# Patient Record
Sex: Male | Born: 1980 | Hispanic: No | Marital: Married | Smoking: Never smoker
Health system: Southern US, Community
[De-identification: ages and names within clinical notes are randomized; demographics above are authoritative.]

## PROBLEM LIST (undated history)

## (undated) DIAGNOSIS — S46819A Strain of other muscles, fascia and tendons at shoulder and upper arm level, unspecified arm, initial encounter: Secondary | ICD-10-CM

## (undated) HISTORY — PX: NO PAST SURGERIES: SHX2092

## (undated) HISTORY — DX: Strain of other muscles, fascia and tendons at shoulder and upper arm level, unspecified arm, initial encounter: S46.819A

---

## 2012-02-03 ENCOUNTER — Ambulatory Visit: Payer: Self-pay | Admitting: Physical Therapy

## 2014-11-02 ENCOUNTER — Encounter: Payer: Self-pay | Admitting: Medical

## 2014-11-02 ENCOUNTER — Ambulatory Visit (INDEPENDENT_AMBULATORY_CARE_PROVIDER_SITE_OTHER): Payer: Managed Care, Other (non HMO) | Admitting: Medical

## 2014-11-02 VITALS — BP 114/66 | HR 66 | Temp 98.2°F | Ht 65.5 in | Wt 176.6 lb

## 2014-11-02 DIAGNOSIS — Z0000 Encounter for general adult medical examination without abnormal findings: Secondary | ICD-10-CM

## 2014-11-02 DIAGNOSIS — Z23 Encounter for immunization: Secondary | ICD-10-CM

## 2014-11-02 DIAGNOSIS — Z0189 Encounter for other specified special examinations: Secondary | ICD-10-CM

## 2014-11-02 HISTORY — DX: Encounter for general adult medical examination without abnormal findings: Z00.00

## 2014-11-02 NOTE — Progress Notes (Signed)
Pre visit review using our clinic review tool, if applicable. No additional management support is needed unless otherwise documented below in the visit note. 

## 2014-11-02 NOTE — Assessment & Plan Note (Signed)
Future labs placed cbc, cmp, tsh, lipid panel to be done fasting.

## 2014-11-02 NOTE — Progress Notes (Signed)
Subjective:    Patient ID: Kathie RhodesVenkat Trochez, male    DOB: [birth year], 33 y.o.   MRN: [id number]  HPI   I have reviewed pt PMH, PSH, FH, Social History and Surgical History  No major medical problems, no surgical history, parents both diabetic.  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 No acute complaints.  Pt never got flu vaccine in the past.   He is not sure on his tetanus. Pt has been in country for 5 yrs from [place]. Pt is willing to get tdap. He has no idea on pertussis. Not sure of standards in [place].  No past medical history on file.  History   Social History  . Marital Status: Married    Spouse Name: N/A    Number of Children: N/A  . Years of Education: N/A   Occupational History  . Not on file.   Social History Main Topics  . Smoking status: Never Smoker   . Smokeless tobacco: Never Used  . Alcohol Use: No  . Drug Use: No  . Sexual Activity: Not on file   Other Topics Concern  . Not on file   Social History Narrative  . No narrative on file    No past surgical history on file.  Family History  Problem Relation Age of Onset  . Diabetes Mother   . Diabetes Father     No Known Allergies  No current outpatient prescriptions on file prior to visit.   No current facility-administered medications on file prior to visit.    BP 114/66 mmHg  Pulse 66  Temp(Src) 98.2 F (36.8 C) (Oral)  Ht 5' 5.5" (1.664 m)  Wt 176 lb 9.6 oz (80.105 kg)  BMI 28.93 kg/m2  SpO2 99%     Review of Systems  Constitutional: Negative.  Negative for fever, chills and fatigue.  HENT: Negative.  Negative for congestion, ear discharge, ear pain, nosebleeds, postnasal drip, rhinorrhea, sinus pressure, sneezing, sore throat and trouble swallowing.   Eyes: Negative.   Respiratory: Negative.  Negative for cough, chest tightness, shortness of breath and wheezing.     Cardiovascular: Negative.  Negative for chest pain and palpitations.  Gastrointestinal: Negative.  Negative for nausea, abdominal pain, diarrhea and rectal pain.  Endocrine: Negative.   Genitourinary: Negative.   Musculoskeletal: Negative.  Negative for back pain.  Skin: Negative.   Allergic/Immunologic: Negative.   Neurological: Negative.  Negative for dizziness, tremors, seizures, syncope, facial asymmetry, weakness, light-headedness, numbness and headaches.  Hematological: Negative.  Negative for adenopathy. Does not bruise/bleed easily.  Psychiatric/Behavioral: Negative.  Negative for suicidal ideas, behavioral problems, self-injury and dysphoric mood. The patient is not nervous/anxious.        Objective:   Physical Exam  General Mental Status- Alert. Orientation- Oriented x3.  Build and Nutrition- Well nourished and Well Developed.  Skin General:-Normal. Color- Normal color. Moisture- Normal. Temperature-Warm.  HEENT  Ears- Normal. Auditory Canal- Bilateral-Normal. Tympanic Membrane- Bilateral-Normal. Eye Fundi-Bilateral-Normal. Pupil- bilateral- Direct reaction to light normal. Nose & Sinuses- Normal. Nostrils-Bilateral- Normal. Mouth & Throat-Normal.  Neck Neck- No Bruits or Masses. Trachea midline.  Thyroid- Normal.  Chest and Lung Exam Percussion: Quality and Intensity-Percussion normal. Percussion of the chest reveals- No Dullness.  Palpation: Palpation of the chest reveals- Non-tender- No dullness. Auscultation: Breath Sounds- Normal.  Adventitous Sounds:-No adventitious sounds.  Cardiovascular Inspection:- No Heaves. Auscultation:-Normal sinus rhythm without murmur gallop, S1 WNL and S2  WNL.  Abdomen Inspection:-Inspection Normal. Inspection of the abdomen reveals- No hernias Palpation/Percussion:- Palpation and Percussion of the Abdomen reveal- Non Tender and No Palpable abdominal masses. Liver: Other Characteristics- No hepatomegaly. Spleen:Other  Characteristics- No Splenomegaly. Auscultation:- Auscultation of the abdomen reveals- Bowel sounds normal and No Abdominal bruits.  Male Genitourinary Declined    Neurologic Mental Status:- Normal. Cranial Nerves:-Normal Bilaterally. Motor:-Normal. Strength:5/5 normal muscle strength-All Muscles. Gait- Normal.  Meningeal Signs- None.  Musculoskeletal Global Assessment General-Joints show full range of motion without obvious deformity and Normal muscle mass. Strength in upper and lower extremities.  Lymphatics General lymphatics Description- No generalized lymphadenopathy.       Assessment & Plan:

## 2014-11-02 NOTE — Patient Instructions (Signed)
If you change your mind on flu vaccine let us know. We gave you tdap today.  I am putting in future labs in computer to do fasting labs next week.  Preventive Care for Adults A healthy lifestyle and preventive care can promote health and wellness. Preventive health guidelines for men include the following key practices:  A routine yearly physical is a good way to check with your health care provider about your health and preventative screening. It is a chance to share any concerns and updates on your health and to receive a thorough exam.  Visit your dentist for a routine exam and preventative care every 6 months. Brush your teeth twice a day and floss once a day. Good oral hygiene prevents tooth decay and gum disease.  The frequency of eye exams is based on your age, health, family medical history, use of contact lenses, and other factors. Follow your health care provider's recommendations for frequency of eye exams.  Eat a healthy diet. Foods such as vegetables, fruits, whole grains, low-fat dairy products, and lean protein foods contain the nutrients you need without too many calories. Decrease your intake of foods high in solid fats, added sugars, and salt. Eat the right amount of calories for you.Get information about a proper diet from your health care provider, if necessary.  Regular physical exercise is one of the most important things you can do for your health. Most adults should get at least 150 minutes of moderate-intensity exercise (any activity that increases your heart rate and causes you to sweat) each week. In addition, most adults need muscle-strengthening exercises on 2 or more days a week.  Maintain a healthy weight. The body mass index (BMI) is a screening tool to identify possible weight problems. It provides an estimate of body fat based on height and weight. Your health care provider can find your BMI and can help you achieve or maintain a healthy weight.For adults 20 years  and older:  A BMI below 18.5 is considered underweight.  A BMI of 18.5 to 24.9 is normal.  A BMI of 25 to 29.9 is considered overweight.  A BMI of 30 and above is considered obese.  Maintain normal blood lipids and cholesterol levels by exercising and minimizing your intake of saturated fat. Eat a balanced diet with plenty of fruit and vegetables. Blood tests for lipids and cholesterol should begin at age 52 and be repeated every 5 years. If your lipid or cholesterol levels are high, you are over 50, or you are at high risk for heart disease, you may need your cholesterol levels checked more frequently.Ongoing high lipid and cholesterol levels should be treated with medicines if diet and exercise are not working.  If you smoke, find out from your health care provider how to quit. If you do not use tobacco, do not start.  Lung cancer screening is recommended for adults aged 82-80 years who are at high risk for developing lung cancer because of a history of smoking. A yearly low-dose CT scan of the lungs is recommended for people who have at least a 30-pack-year history of smoking and are a current smoker or have quit within the past 15 years. A pack year of smoking is smoking an average of 1 pack of cigarettes a day for 1 year (for example: 1 pack a day for 30 years or 2 packs a day for 15 years). Yearly screening should continue until the smoker has stopped smoking for at least 15 years. Yearly  screening should be stopped for people who develop a health problem that would prevent them from having lung cancer treatment.  If you choose to drink alcohol, do not have more than 2 drinks per day. One drink is considered to be 12 ounces (355 mL) of beer, 5 ounces (148 mL) of wine, or 1.5 ounces (44 mL) of liquor.  Avoid use of street drugs. Do not share needles with anyone. Ask for help if you need support or instructions about stopping the use of drugs.  High blood pressure causes heart disease and  increases the risk of stroke. Your blood pressure should be checked at least every 1-2 years. Ongoing high blood pressure should be treated with medicines, if weight loss and exercise are not effective.  If you are 63-71 years old, ask your health care provider if you should take aspirin to prevent heart disease.  Diabetes screening involves taking a blood sample to check your fasting blood sugar level. This should be done once every 3 years, after age 78, if you are within normal weight and without risk factors for diabetes. Testing should be considered at a younger age or be carried out more frequently if you are overweight and have at least 1 risk factor for diabetes.  Colorectal cancer can be detected and often prevented. Most routine colorectal cancer screening begins at the age of 17 and continues through age 41. However, your health care provider may recommend screening at an earlier age if you have risk factors for colon cancer. On a yearly basis, your health care provider may provide home test kits to check for hidden blood in the stool. Use of a small camera at the end of a tube to directly examine the colon (sigmoidoscopy or colonoscopy) can detect the earliest forms of colorectal cancer. Talk to your health care provider about this at age 49, when routine screening begins. Direct exam of the colon should be repeated every 5-10 years through age 73, unless early forms of precancerous polyps or small growths are found.  People who are at an increased risk for hepatitis B should be screened for this virus. You are considered at high risk for hepatitis B if:  You were born in a country where hepatitis B occurs often. Talk with your health care provider about which countries are considered high risk.  Your parents were born in a high-risk country and you have not received a shot to protect against hepatitis B (hepatitis B vaccine).  You have HIV or AIDS.  You use needles to inject street  drugs.  You live with, or have sex with, someone who has hepatitis B.  You are a man who has sex with other men (MSM).  You get hemodialysis treatment.  You take certain medicines for conditions such as cancer, organ transplantation, and autoimmune conditions.  Hepatitis C blood testing is recommended for all people born from [birth year] through 1965 and any individual with known risks for hepatitis C.  Practice safe sex. Use condoms and avoid high-risk sexual practices to reduce the spread of sexually transmitted infections (STIs). STIs include gonorrhea, chlamydia, syphilis, trichomonas, herpes, HPV, and human immunodeficiency virus (HIV). Herpes, HIV, and HPV are viral illnesses that have no cure. They can result in disability, cancer, and death.  If you are at risk of being infected with HIV, it is recommended that you take a prescription medicine daily to prevent HIV infection. This is called preexposure prophylaxis (PrEP). You are considered at risk if:  You  are a man who has sex with other men (MSM) and have other risk factors.  You are a heterosexual man, are sexually active, and are at increased risk for HIV infection.  You take drugs by injection.  You are sexually active with a partner who has HIV.  Talk with your health care provider about whether you are at high risk of being infected with HIV. If you choose to begin PrEP, you should first be tested for HIV. You should then be tested every 3 months for as long as you are taking PrEP.  A one-time screening for abdominal aortic aneurysm (AAA) and surgical repair of large AAAs by ultrasound are recommended for men ages 1 to 71 years who are current or former smokers.  Healthy men should no longer receive prostate-specific antigen (PSA) blood tests as part of routine cancer screening. Talk with your health care provider about prostate cancer screening.  Testicular cancer screening is not recommended for adult males who have no  symptoms. Screening includes self-exam, a health care provider exam, and other screening tests. Consult with your health care provider about any symptoms you have or any concerns you have about testicular cancer.  Use sunscreen. Apply sunscreen liberally and repeatedly throughout the day. You should seek shade when your shadow is shorter than you. Protect yourself by wearing long sleeves, pants, a wide-brimmed hat, and sunglasses year round, whenever you are outdoors.  Once a month, do a whole-body skin exam, using a mirror to look at the skin on your back. Tell your health care provider about new moles, moles that have irregular borders, moles that are larger than a pencil eraser, or moles that have changed in shape or color.  Stay current with required vaccines (immunizations).  Influenza vaccine. All adults should be immunized every year.  Tetanus, diphtheria, and acellular pertussis (Td, Tdap) vaccine. An adult who has not previously received Tdap or who does not know his vaccine status should receive 1 dose of Tdap. This initial dose should be followed by tetanus and diphtheria toxoids (Td) booster doses every 10 years. Adults with an unknown or incomplete history of completing a 3-dose immunization series with Td-containing vaccines should begin or complete a primary immunization series including a Tdap dose. Adults should receive a Td booster every 10 years.  Varicella vaccine. An adult without evidence of immunity to varicella should receive 2 doses or a second dose if he has previously received 1 dose.  Human papillomavirus (HPV) vaccine. Males aged 83-21 years who have not received the vaccine previously should receive the 3-dose series. Males aged 22-26 years may be immunized. Immunization is recommended through the age of 56 years for any male who has sex with males and did not get any or all doses earlier. Immunization is recommended for any person with an immunocompromised condition  through the age of 63 years if he did not get any or all doses earlier. During the 3-dose series, the second dose should be obtained 4-8 weeks after the first dose. The third dose should be obtained 24 weeks after the first dose and 16 weeks after the second dose.  Zoster vaccine. One dose is recommended for adults aged 35 years or older unless certain conditions are present.  Measles, mumps, and rubella (MMR) vaccine. Adults born before [birth year] generally are considered immune to measles and mumps. Adults born in [birth year] or later should have 1 or more doses of MMR vaccine unless there is a contraindication to the vaccine or there  is laboratory evidence of immunity to each of the three diseases. A routine second dose of MMR vaccine should be obtained at least 28 days after the first dose for students attending postsecondary schools, health care workers, or international travelers. People who received inactivated measles vaccine or an unknown type of measles vaccine during 1963-1967 should receive 2 doses of MMR vaccine. People who received inactivated mumps vaccine or an unknown type of mumps vaccine before 1979 and are at high risk for mumps infection should consider immunization with 2 doses of MMR vaccine. Unvaccinated health care workers born before [birth year] who lack laboratory evidence of measles, mumps, or rubella immunity or laboratory confirmation of disease should consider measles and mumps immunization with 2 doses of MMR vaccine or rubella immunization with 1 dose of MMR vaccine.  Pneumococcal 13-valent conjugate (PCV13) vaccine. When indicated, a person who is uncertain of his immunization history and has no record of immunization should receive the PCV13 vaccine. An adult aged 57 years or older who has certain medical conditions and has not been previously immunized should receive 1 dose of PCV13 vaccine. This PCV13 should be followed with a dose of pneumococcal polysaccharide (PPSV23) vaccine. The PPSV23  vaccine dose should be obtained at least 8 weeks after the dose of PCV13 vaccine. An adult aged 65 years or older who has certain medical conditions and previously received 1 or more doses of PPSV23 vaccine should receive 1 dose of PCV13. The PCV13 vaccine dose should be obtained 1 or more years after the last PPSV23 vaccine dose.  Pneumococcal polysaccharide (PPSV23) vaccine. When PCV13 is also indicated, PCV13 should be obtained first. All adults aged 23 years and older should be immunized. An adult younger than age 40 years who has certain medical conditions should be immunized. Any person who resides in a nursing home or long-term care facility should be immunized. An adult smoker should be immunized. People with an immunocompromised condition and certain other conditions should receive both PCV13 and PPSV23 vaccines. People with human immunodeficiency virus (HIV) infection should be immunized as soon as possible after diagnosis. Immunization during chemotherapy or radiation therapy should be avoided. Routine use of PPSV23 vaccine is not recommended for American Indians, [place] Natives, or people younger than 65 years unless there are medical conditions that require PPSV23 vaccine. When indicated, people who have unknown immunization and have no record of immunization should receive PPSV23 vaccine. One-time revaccination 5 years after the first dose of PPSV23 is recommended for people aged 19-64 years who have chronic kidney failure, nephrotic syndrome, asplenia, or immunocompromised conditions. People who received 1-2 doses of PPSV23 before age 33 years should receive another dose of PPSV23 vaccine at age 42 years or later if at least 5 years have passed since the previous dose. Doses of PPSV23 are not needed for people immunized with PPSV23 at or after age 48 years.  Meningococcal vaccine. Adults with asplenia or persistent complement component deficiencies should receive 2 doses of quadrivalent  meningococcal conjugate (MenACWY-D) vaccine. The doses should be obtained at least 2 months apart. Microbiologists working with certain meningococcal bacteria, [place] recruits, people at risk during an outbreak, and people who travel to or live in countries with a high rate of meningitis should be immunized. A first-year college student up through age 45 years who is living in a residence hall should receive a dose if he did not receive a dose on or after his 16th birthday. Adults who have certain high-risk conditions should receive one or  more doses of vaccine.  Hepatitis A vaccine. Adults who wish to be protected from this disease, have certain high-risk conditions, work with hepatitis A-infected animals, work in hepatitis A research labs, or travel to or work in countries with a high rate of hepatitis A should be immunized. Adults who were previously unvaccinated and who anticipate close contact with an international adoptee during the first 60 days after arrival in the [place] from a country with a high rate of hepatitis A should be immunized.  Hepatitis B vaccine. Adults should be immunized if they wish to be protected from this disease, have certain high-risk conditions, may be exposed to blood or other infectious body fluids, are household contacts or sex partners of hepatitis B positive people, are clients or workers in certain care facilities, or travel to or work in countries with a high rate of hepatitis B.  Haemophilus influenzae type b (Hib) vaccine. A previously unvaccinated person with asplenia or sickle cell disease or having a scheduled splenectomy should receive 1 dose of Hib vaccine. Regardless of previous immunization, a recipient of a hematopoietic stem cell transplant should receive a 3-dose series 6-12 months after his successful transplant. Hib vaccine is not recommended for adults with HIV infection. Preventive Service / Frequency Ages 29 to 75  Blood pressure check.** /  Every 1 to 2 years.  Lipid and cholesterol check.** / Every 5 years beginning at age 70.  Hepatitis C blood test.** / For any individual with known risks for hepatitis C.  Skin self-exam. / Monthly.  Influenza vaccine. / Every year.  Tetanus, diphtheria, and acellular pertussis (Tdap, Td) vaccine.** / Consult your health care provider. 1 dose of Td every 10 years.  Varicella vaccine.** / Consult your health care provider.  HPV vaccine. / 3 doses over 6 months, if 27 or younger.  Measles, mumps, rubella (MMR) vaccine.** / You need at least 1 dose of MMR if you were born in 1957 or later. You may also need a second dose.  Pneumococcal 13-valent conjugate (PCV13) vaccine.** / Consult your health care provider.  Pneumococcal polysaccharide (PPSV23) vaccine.** / 1 to 2 doses if you smoke cigarettes or if you have certain conditions.  Meningococcal vaccine.** / 1 dose if you are age 99 to 40 years and a Market researcher living in a residence hall, or have one of several medical conditions. You may also need additional booster doses.  Hepatitis A vaccine.** / Consult your health care provider.  Hepatitis B vaccine.** / Consult your health care provider.  Haemophilus influenzae type b (Hib) vaccine.** / Consult your health care provider. Ages 70 to 38  Blood pressure check.** / Every 1 to 2 years.  Lipid and cholesterol check.** / Every 5 years beginning at age 71.  Lung cancer screening. / Every year if you are aged 95-80 years and have a 30-pack-year history of smoking and currently smoke or have quit within the past 15 years. Yearly screening is stopped once you have quit smoking for at least 15 years or develop a health problem that would prevent you from having lung cancer treatment.  Fecal occult blood test (FOBT) of stool. / Every year beginning at age 74 and continuing until age 2. You may not have to do this test if you get a colonoscopy every 10 years.  Flexible  sigmoidoscopy** or colonoscopy.** / Every 5 years for a flexible sigmoidoscopy or every 10 years for a colonoscopy beginning at age 102 and continuing until age 71.  Hepatitis C blood test.** / For all people born from [birth year] through 1965 and any individual with known risks for hepatitis C.  Skin self-exam. / Monthly.  Influenza vaccine. / Every year.  Tetanus, diphtheria, and acellular pertussis (Tdap/Td) vaccine.** / Consult your health care provider. 1 dose of Td every 10 years.  Varicella vaccine.** / Consult your health care provider.  Zoster vaccine.** / 1 dose for adults aged 41 years or older.  Measles, mumps, rubella (MMR) vaccine.** / You need at least 1 dose of MMR if you were born in 1958 or later. You may also need a second dose.  Pneumococcal 13-valent conjugate (PCV13) vaccine.** / Consult your health care provider.  Pneumococcal polysaccharide (PPSV23) vaccine.** / 1 to 2 doses if you smoke cigarettes or if you have certain conditions.  Meningococcal vaccine.** / Consult your health care provider.  Hepatitis A vaccine.** / Consult your health care provider.  Hepatitis B vaccine.** / Consult your health care provider.  Haemophilus influenzae type b (Hib) vaccine.** / Consult your health care provider. Ages 18 and over  Blood pressure check.** / Every 1 to 2 years.  Lipid and cholesterol check.**/ Every 5 years beginning at age 45.  Lung cancer screening. / Every year if you are aged 20-80 years and have a 30-pack-year history of smoking and currently smoke or have quit within the past 15 years. Yearly screening is stopped once you have quit smoking for at least 15 years or develop a health problem that would prevent you from having lung cancer treatment.  Fecal occult blood test (FOBT) of stool. / Every year beginning at age 45 and continuing until age 34. You may not have to do this test if you get a colonoscopy every 10 years.  Flexible sigmoidoscopy** or  colonoscopy.** / Every 5 years for a flexible sigmoidoscopy or every 10 years for a colonoscopy beginning at age 23 and continuing until age 66.  Hepatitis C blood test.** / For all people born from [birth year] through 1965 and any individual with known risks for hepatitis C.  Abdominal aortic aneurysm (AAA) screening.** / A one-time screening for ages 20 to 64 years who are current or former smokers.  Skin self-exam. / Monthly.  Influenza vaccine. / Every year.  Tetanus, diphtheria, and acellular pertussis (Tdap/Td) vaccine.** / 1 dose of Td every 10 years.  Varicella vaccine.** / Consult your health care provider.  Zoster vaccine.** / 1 dose for adults aged 71 years or older.  Pneumococcal 13-valent conjugate (PCV13) vaccine.** / Consult your health care provider.  Pneumococcal polysaccharide (PPSV23) vaccine.** / 1 dose for all adults aged 87 years and older.  Meningococcal vaccine.** / Consult your health care provider.  Hepatitis A vaccine.** / Consult your health care provider.  Hepatitis B vaccine.** / Consult your health care provider.  Haemophilus influenzae type b (Hib) vaccine.** / Consult your health care provider. **Family history and personal history of risk and conditions may change your health care provider's recommendations. Document Released: 12/29/2001 Document Revised: 11/07/2013 Document Reviewed: 03/30/2011 [facility] Patient Information 2015 [place], [place]. This information is not intended to replace advice given to you by your health care provider. Make sure you discuss any questions you have with your health care provider.

## 2014-11-06 ENCOUNTER — Other Ambulatory Visit: Payer: Managed Care, Other (non HMO)

## 2014-11-07 ENCOUNTER — Other Ambulatory Visit: Payer: Managed Care, Other (non HMO)

## 2014-11-08 ENCOUNTER — Other Ambulatory Visit (INDEPENDENT_AMBULATORY_CARE_PROVIDER_SITE_OTHER): Payer: Managed Care, Other (non HMO)

## 2014-11-08 DIAGNOSIS — Z0189 Encounter for other specified special examinations: Secondary | ICD-10-CM

## 2014-11-08 DIAGNOSIS — Z0000 Encounter for general adult medical examination without abnormal findings: Secondary | ICD-10-CM

## 2014-11-08 LAB — CBC WITH DIFFERENTIAL/PLATELET
BASOS ABS: 0 10*3/uL (ref 0.0–0.1)
BASOS PCT: 0.5 % (ref 0.0–3.0)
Eosinophils Absolute: 0 10*3/uL (ref 0.0–0.7)
Eosinophils Relative: 0.7 % (ref 0.0–5.0)
HCT: 43.5 % (ref 39.0–52.0)
HEMOGLOBIN: 14.2 g/dL (ref 13.0–17.0)
Lymphocytes Relative: 31.6 % (ref 12.0–46.0)
Lymphs Abs: 1.8 10*3/uL (ref 0.7–4.0)
MCHC: 32.7 g/dL (ref 30.0–36.0)
MCV: 86.6 fl (ref 78.0–100.0)
MONO ABS: 0.4 10*3/uL (ref 0.1–1.0)
Monocytes Relative: 6.3 % (ref 3.0–12.0)
NEUTROS ABS: 3.4 10*3/uL (ref 1.4–7.7)
NEUTROS PCT: 60.9 % (ref 43.0–77.0)
Platelets: 195 10*3/uL (ref 150.0–400.0)
RBC: 5.03 Mil/uL (ref 4.22–5.81)
RDW: 12.7 % (ref 11.5–15.5)
WBC: 5.6 10*3/uL (ref 4.0–10.5)

## 2014-11-08 LAB — LIPID PANEL
CHOLESTEROL: 171 mg/dL (ref 0–200)
HDL: 35.8 mg/dL — AB (ref >39.00)
LDL Cholesterol: 106 mg/dL — ABNORMAL HIGH (ref 0–99)
NonHDL: 135.2
Total CHOL/HDL Ratio: 5
Triglycerides: 144 mg/dL (ref 0.0–149.0)
VLDL: 28.8 mg/dL (ref 0.0–40.0)

## 2014-11-08 LAB — COMPREHENSIVE METABOLIC PANEL
ALK PHOS: 54 U/L (ref 39–117)
ALT: 16 U/L (ref 0–53)
AST: 20 U/L (ref 0–37)
Albumin: 4.1 g/dL (ref 3.5–5.2)
BUN: 11 mg/dL (ref 6–23)
CO2: 24 mEq/L (ref 19–32)
Calcium: 9.1 mg/dL (ref 8.4–10.5)
Chloride: 106 mEq/L (ref 96–112)
Creatinine, Ser: 0.8 mg/dL (ref 0.4–1.5)
GFR: 115.47 mL/min (ref >60.00)
Glucose, Bld: 101 mg/dL — ABNORMAL HIGH (ref 70–99)
Potassium: 4.1 mEq/L (ref 3.5–5.1)
Sodium: 137 mEq/L (ref 135–145)
Total Bilirubin: 0.3 mg/dL (ref 0.2–1.2)
Total Protein: 7.3 g/dL (ref 6.0–8.3)

## 2014-11-08 LAB — TSH: TSH: 2.13 u[IU]/mL (ref 0.35–4.50)

## 2015-01-30 ENCOUNTER — Ambulatory Visit (HOSPITAL_BASED_OUTPATIENT_CLINIC_OR_DEPARTMENT_OTHER)
Admission: RE | Admit: 2015-01-30 | Discharge: 2015-01-30 | Disposition: A | Discharge status: Home / Self Care | Payer: Managed Care, Other (non HMO) | Source: Ambulatory Visit | Attending: Medical | Admitting: Medical

## 2015-01-30 ENCOUNTER — Other Ambulatory Visit: Payer: Self-pay | Admitting: Medical

## 2015-01-30 ENCOUNTER — Ambulatory Visit (INDEPENDENT_AMBULATORY_CARE_PROVIDER_SITE_OTHER): Payer: Managed Care, Other (non HMO) | Admitting: Medical

## 2015-01-30 ENCOUNTER — Encounter: Payer: Self-pay | Admitting: Medical

## 2015-01-30 VITALS — BP 90/55 | HR 65 | Temp 98.0°F | Ht 65.5 in | Wt 160.0 lb

## 2015-01-30 DIAGNOSIS — M542 Cervicalgia: Secondary | ICD-10-CM | POA: Diagnosis present

## 2015-01-30 DIAGNOSIS — S46811A Strain of other muscles, fascia and tendons at shoulder and upper arm level, right arm, initial encounter: Secondary | ICD-10-CM

## 2015-01-30 DIAGNOSIS — M25511 Pain in right shoulder: Secondary | ICD-10-CM | POA: Diagnosis not present

## 2015-01-30 DIAGNOSIS — M5412 Radiculopathy, cervical region: Secondary | ICD-10-CM

## 2015-01-30 DIAGNOSIS — M47892 Other spondylosis, cervical region: Secondary | ICD-10-CM | POA: Insufficient documentation

## 2015-01-30 DIAGNOSIS — M25512 Pain in left shoulder: Secondary | ICD-10-CM | POA: Diagnosis not present

## 2015-01-30 DIAGNOSIS — S46819A Strain of other muscles, fascia and tendons at shoulder and upper arm level, unspecified arm, initial encounter: Secondary | ICD-10-CM

## 2015-01-30 HISTORY — DX: Radiculopathy, cervical region: M54.12

## 2015-01-30 HISTORY — DX: Strain of other muscles, fascia and tendons at shoulder and upper arm level, unspecified arm, initial encounter: S46.819A

## 2015-01-30 MED ORDER — HYDROCODONE-ACETAMINOPHEN 5-325 MG PO TABS: 1.0000 | ORAL_TABLET | Freq: Four times a day (QID) | ORAL | Status: DC | PRN

## 2015-01-30 MED ORDER — DICLOFENAC SODIUM 75 MG PO TBEC: 75.0000 mg | DELAYED_RELEASE_TABLET | Freq: Two times a day (BID) | ORAL | Status: DC

## 2015-01-30 MED ORDER — CYCLOBENZAPRINE HCL 10 MG PO TABS: 10.0000 mg | ORAL_TABLET | Freq: Every day | ORAL | Status: DC

## 2015-01-30 NOTE — Assessment & Plan Note (Signed)
Will get cspine xray. If neuro foraminal narrowing then will consider referral to neurosurgeon or mri.

## 2015-01-30 NOTE — Progress Notes (Signed)
   Subjective:    Patient ID: Edgar RhodesVenkat Rohman, male    DOB: 1981/03/12, 34 y.o.   MRN: [id number]  HPI   Pt in with some side of neck pain on the right side. Pain started little bit yesterday afternoon. Today 8/10 pain. Pt has hx occasional  neck pain on rt side  in the past. Was [place] in [place]. Pt may have had some disc space decrease on xray. Neck  pain that radiates to left shoulder also ( this is every day last one to times a day and transient). But pain rt now is mostly rt side of neck.     Review of Systems  Constitutional: Negative for fever, chills, diaphoresis, activity change and fatigue.  Respiratory: Negative for cough, chest tightness and shortness of breath.   Cardiovascular: Negative for chest pain, palpitations and leg swelling.  Gastrointestinal: Negative for nausea, vomiting and abdominal pain.  Musculoskeletal: Negative for neck pain and neck stiffness.       Neck pain.  Neurological: Negative for dizziness, tremors, seizures, syncope, facial asymmetry, speech difficulty, weakness, light-headedness, numbness and headaches.       Some pain radiating from neck to lt shoulder.  Psychiatric/Behavioral: Negative for behavioral problems, confusion and agitation. The patient is not nervous/anxious.    Past Medical History  Diagnosis Date  . Trapezius strain 01/30/2015    History   Social History  . Marital Status: Married    Spouse Name: N/A  . Number of Children: N/A  . Years of Education: N/A   Occupational History  . Not on file.   Social History Main Topics  . Smoking status: Never Smoker   . Smokeless tobacco: Never Used  . Alcohol Use: No  . Drug Use: No  . Sexual Activity: Not on file   Other Topics Concern  . Not on file   Social History Narrative    No past surgical history on file.  Family History  Problem Relation Age of Onset  . Diabetes Mother   . Diabetes Father     No Known Allergies  No current outpatient prescriptions on file  prior to visit.   No current facility-administered medications on file prior to visit.    BP 90/55 mmHg  Pulse 65  Temp(Src) 98 F (36.7 C) (Oral)  Ht 5' 5.5" (1.664 m)  Wt 160 lb (72.576 kg)  BMI 26.21 kg/m2  SpO2 100%       Objective:   Physical Exam  General- No acute distress. Pleasant patient. Neck- Full range of motion, no jvd Lungs- Clear, even and unlabored. Heart- regular rate and rhythm. Neurologic- CNII- XII grossly intact. Neck- rt sided mid trapezius pain on palpation. Back- medial to rt scapula pain on palpation.      Assessment & Plan:

## 2015-01-30 NOTE — Assessment & Plan Note (Signed)
Diclofenac nsaid, flexeril muscle relaxant. Limited number of norco if pain severe. Thermacare heat patch. If pain persist refer to PT.

## 2015-01-30 NOTE — Progress Notes (Signed)
Pre visit review using our clinic review tool, if applicable. No additional management support is needed unless otherwise documented below in the visit note. 

## 2015-01-30 NOTE — Patient Instructions (Signed)
Trapezius strain Diclofenac nsaid, flexeril muscle relaxant. Limited number of norco if pain severe. Thermacare heat patch. If pain persist refer to PT.   Cervical radicular pain Will get cspine xray. If neuro foraminal narrowing then will consider referral to neurosurgeon or mri.     Follow up in 7-10 days or as needed.

## 2015-02-12 ENCOUNTER — Telehealth: Payer: Self-pay | Admitting: Medical

## 2015-02-12 DIAGNOSIS — M47812 Spondylosis without myelopathy or radiculopathy, cervical region: Secondary | ICD-10-CM

## 2015-02-12 DIAGNOSIS — M5410 Radiculopathy, site unspecified: Secondary | ICD-10-CM

## 2015-02-12 DIAGNOSIS — M542 Cervicalgia: Secondary | ICD-10-CM

## 2015-02-12 NOTE — Telephone Encounter (Signed)
Refer to neurosurgeon at pt request.

## 2015-02-13 NOTE — Telephone Encounter (Signed)
Patient notified he will be referred to Neurosurgeon.

## 2015-02-21 ENCOUNTER — Telehealth: Payer: Self-pay | Admitting: Medical

## 2015-02-21 NOTE — Telephone Encounter (Signed)
He was in on March 16 for a visit with Ramon DredgeEdward for back pain.  He received a bill and he feels it should have been coded as preventive care.

## 2015-03-08 NOTE — Telephone Encounter (Signed)
Patient calling back regarding this stating that he has received another bill for this DOS. Best # [phone]

## 2018-03-28 ENCOUNTER — Other Ambulatory Visit (HOSPITAL_BASED_OUTPATIENT_CLINIC_OR_DEPARTMENT_OTHER): Payer: Self-pay | Admitting: Student

## 2018-03-28 DIAGNOSIS — M5412 Radiculopathy, cervical region: Secondary | ICD-10-CM

## 2018-04-02 ENCOUNTER — Ambulatory Visit (HOSPITAL_BASED_OUTPATIENT_CLINIC_OR_DEPARTMENT_OTHER): Payer: 59

## 2019-10-06 ENCOUNTER — Encounter: Payer: Self-pay | Admitting: Cardiology

## 2019-10-06 ENCOUNTER — Ambulatory Visit (INDEPENDENT_AMBULATORY_CARE_PROVIDER_SITE_OTHER): Payer: Managed Care, Other (non HMO) | Admitting: Cardiology

## 2019-10-06 ENCOUNTER — Other Ambulatory Visit: Payer: Self-pay

## 2019-10-06 VITALS — BP 116/64 | HR 59 | Ht 66.0 in | Wt 143.1 lb

## 2019-10-06 DIAGNOSIS — Z1329 Encounter for screening for other suspected endocrine disorder: Secondary | ICD-10-CM

## 2019-10-06 DIAGNOSIS — R0789 Other chest pain: Secondary | ICD-10-CM

## 2019-10-06 DIAGNOSIS — Z13220 Encounter for screening for lipoid disorders: Secondary | ICD-10-CM | POA: Diagnosis not present

## 2019-10-06 HISTORY — DX: Other chest pain: R07.89

## 2019-10-06 NOTE — Patient Instructions (Signed)
Medication Instructions:  Your physician recommends that you continue on your current medications as directed. Please refer to the Current Medication list given to you today.  *If you need a refill on your cardiac medications before your next appointment, please call your pharmacy*  Lab Work: Your physician recommends that you have a BMP, CBC, TSH, hepatic and lipid drawn today If you have labs (blood work) drawn today and your tests are completely normal, you will receive your results only by: . MyChart Message (if you have MyChart) OR . A paper copy in the mail If you have any lab test that is abnormal or we need to change your treatment, we will call you to review the results.  Testing/Procedures: You had an EKG performed today  Follow-Up: At [facility], you and your health needs are our priority.  As part of our continuing mission to provide you with exceptional heart care, we have created designated Provider Care Teams.  These Care Teams include your primary Cardiologist (physician) and Advanced Practice Providers (APPs -  Physician Assistants and Nurse Practitioners) who all work together to provide you with the care you need, when you need it.  Your next appointment:   6 months  The format for your next appointment:   In Person  Provider:   Rajan Revankar, MD    

## 2019-10-06 NOTE — Progress Notes (Signed)
Cardiology Office Note:    Date:  10/06/2019   ID:  Edgar Huang, DOB 11-27-1980, MRN [id number]  PCP:  Patient, No Pcp Per  Cardiologist:  Garwin Brothers, MD   Referring MD: No ref. provider found    ASSESSMENT:    1. Chest discomfort    PLAN:    In order of problems listed above:  1. Chest discomfort: Patient's symptoms are very typical of musculoskeletal.  I was able to reproduce his chest discomfort with pressure.  He said he was very apprehensive about it and I had a significant discussion with him and reassured him about it.  He is a healthy gentleman with no risk factors for coronary artery disease.  He exercises on a regular basis.  I asked him to take NSAIDs on a as needed basis.  We will do all blood work per his request as he is fasting this morning.  He knows to call me for any concerns.  He also knows to go to the nearest emergency room for any significant issues.  He will be seen in follow-up appointment in 6 months or earlier if he has any concerns.  He had multiple questions which were answered to his satisfaction.   Medication Adjustments/Labs and Tests Ordered: Current medicines are reviewed at length with the patient today.  Concerns regarding medicines are outlined above.  No orders of the defined types were placed in this encounter.  No orders of the defined types were placed in this encounter.    History of Present Illness:    Edgar Huang is a 38 y.o. male who is being seen today for the evaluation of chest discomfort.  He is a self-referral.  The patient a pleasant 38-year male.  He has no significant past medical history.  He lost his father in [place] due to [place] about a week ago.  This happened last Sunday and the patient for obvious reasons was significantly shocked with this event.  His father was in overall good health.  Patient experienced some chest discomfort and therefore wanted to be evaluated.  He mentions to me that his chest discomfort  happens when he puts pressure on the sternum.  He is a physically active gentleman.  He exercises on a regular basis till about 8 days ago with exercise he did not have any chest pain or chest tightness.  He also mentions to me that there is no radiation to the neck or to the arm.  When he lifts his son he feels some chest discomfort in the sternal area also.  At the time of my evaluation, the patient is alert awake oriented and in no distress.  Past Medical History:  Diagnosis Date   Trapezius strain 01/30/2015    History reviewed. No pertinent surgical history.  Current Medications: No outpatient medications have been marked as taking for the 10/06/19 encounter (Office Visit) with Kaylah Chiasson, Aundra Dubin, MD.     Allergies:   Patient has no known allergies.   Social History   Socioeconomic History   Marital status: Married    Spouse name: Not on file   Number of children: Not on file   Years of education: Not on file   Highest education level: Not on file  Occupational History   Not on file  Social Needs   Financial resource strain: Not on file   Food insecurity    Worry: Not on file    Inability: Not on file   Transportation needs  Medical: Not on file    Non-medical: Not on file  Tobacco Use   Smoking status: Never Smoker   Smokeless tobacco: Never Used  Substance and Sexual Activity   Alcohol use: No    Alcohol/week: 0.0 standard drinks   Drug use: No   Sexual activity: Not on file  Lifestyle   Physical activity    Days per week: Not on file    Minutes per session: Not on file   Stress: Not on file  Relationships   Social connections    Talks on phone: Not on file    Gets together: Not on file    Attends religious service: Not on file    Active member of club or organization: Not on file    Attends meetings of clubs or organizations: Not on file    Relationship status: Not on file  Other Topics Concern   Not on file  Social History Narrative     Not on file     Family History: The patient's family history includes Diabetes in his father and mother.  ROS:   Please see the history of present illness.    All other systems reviewed and are negative.  EKGs/Labs/Other Studies Reviewed:    The following studies were reviewed today: EKG reveals sinus bradycardia nonspecific ST-T changes   Recent Labs: No results found for requested labs within last 8760 hours.  Recent Lipid Panel    Component Value Date/Time   CHOL 171 11/08/2014 0910   TRIG 144.0 11/08/2014 0910   HDL 35.80 (L) 11/08/2014 0910   CHOLHDL 5 11/08/2014 0910   VLDL 28.8 11/08/2014 0910   LDLCALC 106 (H) 11/08/2014 0910    Physical Exam:    VS:  BP 116/64 (BP Location: Left Arm, Patient Position: Sitting, Cuff Size: Normal)    Pulse (!) 59    Ht 5\' 6"  (1.676 m)    Wt 143 lb 1.3 oz (64.9 kg)    SpO2 98%    BMI 23.09 kg/m     Wt Readings from Last 3 Encounters:  10/06/19 143 lb 1.3 oz (64.9 kg)  01/30/15 160 lb (72.6 kg)  11/02/14 176 lb 9.6 oz (80.1 kg)     GEN: Patient is in no acute distress HEENT: Normal NECK: No JVD; No carotid bruits LYMPHATICS: No lymphadenopathy CARDIAC: S1 S2 regular, 2/6 systolic murmur at the apex. RESPIRATORY:  Clear to auscultation without rales, wheezing or rhonchi  ABDOMEN: Soft, non-tender, non-distended MUSCULOSKELETAL:  No edema; No deformity  SKIN: Warm and dry NEUROLOGIC:  Alert and oriented x 3 PSYCHIATRIC:  Normal affect    Signed, Jenean Lindau, MD  10/06/2019 9:34 AM    [place]

## 2019-10-07 LAB — HEPATIC FUNCTION PANEL
ALT: 10 IU/L (ref 0–44)
AST: 16 IU/L (ref 0–40)
Albumin: 4.4 g/dL (ref 4.0–5.0)
Alkaline Phosphatase: 64 IU/L (ref 39–117)
Bilirubin Total: 0.4 mg/dL (ref 0.0–1.2)
Bilirubin, Direct: 0.11 mg/dL (ref 0.00–0.40)
Total Protein: 6.9 g/dL (ref 6.0–8.5)

## 2019-10-07 LAB — BASIC METABOLIC PANEL
BUN/Creatinine Ratio: 11 (ref 9–20)
BUN: 10 mg/dL (ref 6–20)
CO2: 23 mmol/L (ref 20–29)
Calcium: 9.3 mg/dL (ref 8.7–10.2)
Chloride: 104 mmol/L (ref 96–106)
Creatinine, Ser: 0.87 mg/dL (ref 0.76–1.27)
GFR calc Af Amer: 126 mL/min/{1.73_m2} (ref >59)
GFR calc non Af Amer: 109 mL/min/{1.73_m2} (ref >59)
Glucose: 101 mg/dL — ABNORMAL HIGH (ref 65–99)
Potassium: 5.1 mmol/L (ref 3.5–5.2)
Sodium: 140 mmol/L (ref 134–144)

## 2019-10-07 LAB — LIPID PANEL
Chol/HDL Ratio: 3.9 ratio (ref 0.0–5.0)
Cholesterol, Total: 153 mg/dL (ref 100–199)
HDL: 39 mg/dL — ABNORMAL LOW (ref >39)
LDL Chol Calc (NIH): 99 mg/dL (ref 0–99)
Triglycerides: 79 mg/dL (ref 0–149)
VLDL Cholesterol Cal: 15 mg/dL (ref 5–40)

## 2019-10-07 LAB — CBC
Hematocrit: 39.7 % (ref 37.5–51.0)
Hemoglobin: 13.4 g/dL (ref 13.0–17.7)
MCH: 28.4 pg (ref 26.6–33.0)
MCHC: 33.8 g/dL (ref 31.5–35.7)
MCV: 84 fL (ref 79–97)
Platelets: 210 10*3/uL (ref 150–450)
RBC: 4.72 x10E6/uL (ref 4.14–5.80)
RDW: 12.4 % (ref 11.6–15.4)
WBC: 4.7 10*3/uL (ref 3.4–10.8)

## 2019-10-07 LAB — TSH: TSH: 3.23 u[IU]/mL (ref 0.450–4.500)

## 2019-10-19 ENCOUNTER — Telehealth: Payer: Self-pay

## 2019-10-19 NOTE — Telephone Encounter (Signed)
Results relayed, no PCP on file 

## 2019-10-19 NOTE — Telephone Encounter (Signed)
-----   Message from Jenean Lindau, MD sent at 10/09/2019  8:14 AM EST ----- The results of the study is unremarkable. Please inform patient. I will discuss in detail at next appointment. Cc  primary care/referring physician Jenean Lindau, MD 10/09/2019 8:14 AM

## 2021-09-17 DIAGNOSIS — M542 Cervicalgia: Secondary | ICD-10-CM | POA: Insufficient documentation

## 2021-09-17 DIAGNOSIS — G8929 Other chronic pain: Secondary | ICD-10-CM | POA: Insufficient documentation

## 2021-09-17 HISTORY — DX: Cervicalgia: M54.2

## 2021-09-17 HISTORY — DX: Other chronic pain: G89.29

## 2022-02-23 ENCOUNTER — Ambulatory Visit (INDEPENDENT_AMBULATORY_CARE_PROVIDER_SITE_OTHER): Payer: Managed Care, Other (non HMO) | Admitting: Cardiology

## 2022-02-23 VITALS — BP 104/64 | HR 65 | Ht 66.0 in | Wt 178.0 lb

## 2022-02-23 DIAGNOSIS — R0789 Other chest pain: Secondary | ICD-10-CM | POA: Diagnosis not present

## 2022-02-23 DIAGNOSIS — E7800 Pure hypercholesterolemia, unspecified: Secondary | ICD-10-CM

## 2022-02-23 NOTE — Patient Instructions (Signed)
Medication Instructions:  ?Your physician recommends that you continue on your current medications as directed. Please refer to the Current Medication list given to you today.  ?*If you need a refill on your cardiac medications before your next appointment, please call your pharmacy* ? ? ?Lab Work: ?None ordered ?If you have labs (blood work) drawn today and your tests are completely normal, you will receive your results only by: ?MyChart Message (if you have MyChart) OR ?A paper copy in the mail ?If you have any lab test that is abnormal or we need to change your treatment, we will call you to review the results. ? ? ?Testing/Procedures: ?We will order CT coronary calcium score. ?It will cost $99.00 and is not covered by insurance.  ?Please call to schedule.   ? ?[facility]  ?[address]  ?[place], [place] [address] ?[phone] ?           Or ?[facility] [place] ?[address] ?[place], [place] [address] ?[phone] ? ? ? ?Follow-Up: ?At [facility], you and your health needs are our priority.  As part of our continuing mission to provide you with exceptional heart care, we have created designated Provider Care Teams.  These Care Teams include your primary Cardiologist (physician) and Advanced Practice Providers (APPs -  Physician Assistants and Nurse Practitioners) who all work together to provide you with the care you need, when you need it. ? ?We recommend signing up for the patient portal called "MyChart".  Sign up information is provided on this After Visit Summary.  MyChart is used to connect with patients for Virtual Visits (Telemedicine).  Patients are able to view lab/test results, encounter notes, upcoming appointments, etc.  Non-urgent messages can be sent to your provider as well.   ?To learn more about what you can do with MyChart, go to [email or web address].   ? ?Your next appointment:   ?12 month(s) ? ?The format for your next appointment:   ?In Person ? ?Provider:    ?Rajan Revankar, MD ? ? ?Other Instructions ? ?Coronary Calcium Scan ?A coronary calcium scan is an imaging test used to look for deposits of plaque in the inner lining of the blood vessels of the heart (coronary arteries). Plaque is made up of calcium, protein, and fatty substances. These deposits of plaque can partly clog and narrow the coronary arteries without producing any symptoms or warning signs. This puts a person at risk for a heart attack. ?This test is recommended for people who are at moderate risk for heart disease. The test can find plaque deposits before symptoms develop. ?Tell a health care provider about: ?Any allergies you have. ?All medicines you are taking, including vitamins, herbs, eye drops, creams, and over-the-counter medicines. ?Any problems you or family members have had with anesthetic medicines. ?Any blood disorders you have. ?Any surgeries you have had. ?Any medical conditions you have. ?Whether you are pregnant or may be pregnant. ?What are the risks? ?Generally, this is a safe procedure. However, problems may occur, including: ?Harm to a pregnant woman and her unborn baby. This test involves the use of radiation. Radiation exposure can be dangerous to a pregnant woman and her unborn baby. If you are pregnant or think you may be pregnant, you should not have this procedure done. ?Slight increase in the risk of cancer. This is because of the radiation involved in the test. ?What happens before the procedure? ?Ask your health care provider for any specific instructions on   how to prepare for this procedure. You may be asked to avoid products that contain caffeine, tobacco, or nicotine for 4 hours before the procedure. ?What happens during the procedure? ? ?You will undress and remove any jewelry from your neck or chest. ?You will put on a hospital gown. ?Sticky electrodes will be placed on your chest. The electrodes will be connected to an electrocardiogram (ECG) machine to record a  tracing of the electrical activity of your heart. ?You will lie down on a curved bed that is attached to the CT scanner. ?You may be given medicine to slow down your heart rate so that clear pictures can be created. ?You will be moved into the CT scanner, and the CT scanner will take pictures of your heart. During this time, you will be asked to lie still and hold your breath for 2-3 seconds at a time while each picture of your heart is being taken. ?The procedure may vary among health care providers and hospitals. ?What happens after the procedure? ?You can get dressed. ?You can return to your normal activities. ?It is up to you to get the results of your procedure. Ask your health care provider, or the department that is doing the procedure, when your results will be ready. ?Summary ?A coronary calcium scan is an imaging test used to look for deposits of plaque in the inner lining of the blood vessels of the heart (coronary arteries). Plaque is made up of calcium, protein, and fatty substances. ?Generally, this is a safe procedure. Tell your health care provider if you are pregnant or may be pregnant. ?Ask your health care provider for any specific instructions on how to prepare for this procedure. ?A CT scanner will take pictures of your heart. ?You can return to your normal activities after the scan is done. ?This information is not intended to replace advice given to you by your health care provider. Make sure you discuss any questions you have with your health care provider. ?Document Revised: 05/18/2019 Document Reviewed: 05/23/2019 ?Elsevier Patient Education ? 2022 Elsevier Inc. ? ?

## 2022-02-23 NOTE — Progress Notes (Signed)
?Cardiology Office Note:   ? ?Date:  02/23/2022  ? ?Edgar Huang, DOB 06-27-1981, MRN [id number] ? ?PCP:  Truett Perna, MD  ?Cardiologist:  Garwin Brothers, MD  ? ?Referring MD: No ref. provider found  ? ? ?ASSESSMENT:   ? ?1. Chest discomfort   ? ?PLAN:   ? ?In order of problems listed above: ? ?Primary prevention stressed with the patient.  Importance of compliance with diet medication stressed any vocalized understanding. ?I reviewed his lipids and they are fine. ?Coronary risk stratification: In view of his concerns I discussed coronary calcium scoring and is agreeable.  He was advised to walk at least half an hour a day 5 days a week and he promises to do so. ?Patient will be seen in follow-up appointment in 12 months or earlier if the patient has any concerns ? ? ? ?Medication Adjustments/Labs and Tests Ordered: ?Current medicines are reviewed at length with the patient today.  Concerns regarding medicines are outlined above.  ?No orders of the defined types were placed in this encounter. ? ?No orders of the defined types were placed in this encounter. ? ? ? ?No chief complaint on file. ?  ? ?History of Present Illness:   ? ?Edgar Huang is a 41 y.o. male.  Patient has no significant past medical history.  He has chronic right shoulder and neck issues for which she is receiving therapy and physical therapy.  He is here for routine cardiac evaluation.  He mentioned to me that he is concerned because his brother-in-law which is his wife's brother has had some coronary issues and that prompted him to seek evaluation.  He exercises on a regular basis.  He tells me that the last time he walked was about 20 minutes without any symptoms.  No chest pain orthopnea or PND.  At the time of my evaluation, the patient is alert awake oriented and in no distress. ? ?Past Medical History:  ?Diagnosis Date  ? Cervical radicular pain 01/30/2015  ? Chest discomfort 10/06/2019  ? Chronic right shoulder pain 09/17/2021  ?  Neck pain 09/17/2021  ? Trapezius strain 01/30/2015  ? Wellness examination 11/02/2014  ? ? ?Past Surgical History:  ?Procedure Laterality Date  ? NO PAST SURGERIES    ? ? ?Current Medications: ?Current Meds  ?Medication Sig  ? Multiple Vitamin (MULTIVITAMIN PO) Take 1 tablet by mouth daily.  ?  ? ?Allergies:   Patient has no known allergies.  ? ?Social History  ? ?Socioeconomic History  ? Marital status: Married  ?  Spouse name: Not on file  ? Number of children: Not on file  ? Years of education: Not on file  ? Highest education level: Not on file  ?Occupational History  ? Not on file  ?Tobacco Use  ? Smoking status: Never  ? Smokeless tobacco: Never  ?Substance and Sexual Activity  ? Alcohol use: No  ?  Alcohol/week: 0.0 standard drinks  ? Drug use: No  ? Sexual activity: Not on file  ?Other Topics Concern  ? Not on file  ?Social History Narrative  ? Not on file  ? ?Social Determinants of Health  ? ?Financial Resource Strain: Not on file  ?Food Insecurity: Not on file  ?Transportation Needs: Not on file  ?Physical Activity: Not on file  ?Stress: Not on file  ?Social Connections: Not on file  ?  ? ?Family History: ?The patient's family history includes Diabetes in his father and mother. ? ?ROS:   ?  Please see the history of present illness.    ?All other systems reviewed and are negative. ? ?EKGs/Labs/Other Studies Reviewed:   ? ?The following studies were reviewed today: ?EKG reveals sinus rhythm and nonspecific ST-T changes. ? ? ?Recent Labs: ?No results found for requested labs within last 8760 hours.  ?Recent Lipid Panel ?   ?Component Value Date/Time  ? CHOL 153 10/06/2019 0942  ? TRIG 79 10/06/2019 0942  ? HDL 39 (L) 10/06/2019 0942  ? CHOLHDL 3.9 10/06/2019 0942  ? CHOLHDL 5 11/08/2014 0910  ? VLDL 28.8 11/08/2014 0910  ? LDLCALC 99 10/06/2019 0942  ? ? ?Physical Exam:   ? ?VS:  BP 104/64   Pulse 65   Ht 5\' 6"  (1.676 m)   Wt 178 lb 0.6 oz (80.8 kg)   SpO2 99%   BMI 28.74 kg/m?    ? ?Wt Readings from Last  3 Encounters:  ?02/23/22 178 lb 0.6 oz (80.8 kg)  ?10/06/19 143 lb 1.3 oz (64.9 kg)  ?01/30/15 160 lb (72.6 kg)  ?  ? ?GEN: Patient is in no acute distress ?HEENT: Normal ?NECK: No JVD; No carotid bruits ?LYMPHATICS: No lymphadenopathy ?CARDIAC: Hear sounds regular, 2/6 systolic murmur at the apex. ?RESPIRATORY:  Clear to auscultation without rales, wheezing or rhonchi  ?ABDOMEN: Soft, non-tender, non-distended ?MUSCULOSKELETAL:  No edema; No deformity  ?SKIN: Warm and dry ?NEUROLOGIC:  Alert and oriented x 3 ?PSYCHIATRIC:  Normal affect  ? ?Signed, ?02/01/15, MD  ?02/23/2022 2:56 PM    ?[facility]  ?

## 2022-03-11 ENCOUNTER — Ambulatory Visit (HOSPITAL_BASED_OUTPATIENT_CLINIC_OR_DEPARTMENT_OTHER)
Admission: RE | Admit: 2022-03-11 | Discharge: 2022-03-11 | Disposition: A | Discharge status: Home / Self Care | Payer: Managed Care, Other (non HMO) | Source: Ambulatory Visit | Attending: Cardiology | Admitting: Cardiology

## 2022-03-11 DIAGNOSIS — E7800 Pure hypercholesterolemia, unspecified: Secondary | ICD-10-CM

## 2022-07-07 DIAGNOSIS — M4802 Spinal stenosis, cervical region: Secondary | ICD-10-CM

## 2022-07-07 HISTORY — DX: Spinal stenosis, cervical region: M48.02

## 2023-02-16 IMAGING — CT CT CARDIAC CORONARY ARTERY CALCIUM SCORE
2 series · 15 of 20 positions shown, 17 images · non-contrast
Comparison: None.
COMPARISON: None.

Addendum:
EXAM:
OVER-READ INTERPRETATION  CT CHEST

The following report is an over-read performed by radiologist Dr. NAZARETH
Huljusija [REDACTED] on 03/11/2022. This over-read
does not include interpretation of cardiac or coronary anatomy or
pathology. The coronary calcium score interpretation by the
cardiologist is attached.
CLINICAL DATA: Cardiovascular Disease Risk stratification
Coronary Calcium Score
TECHNIQUE: A gated, non-contrast computed tomography scan of the heart was
performed using 3mm slice thickness. Axial images were analyzed on a
dedicated workstation. Calcium scoring of the coronary arteries was
performed using the Agatston method.

[Series 2: cascseq 3.0 b35f 70% · axial · 0.39mm/px · z∈[-230,-140]mm · 7 of 45 slices shown]
[im 5/45  vessel]
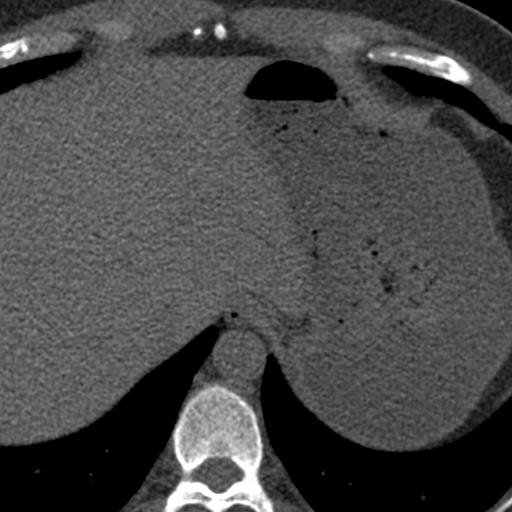
[im 10/45  vessel]
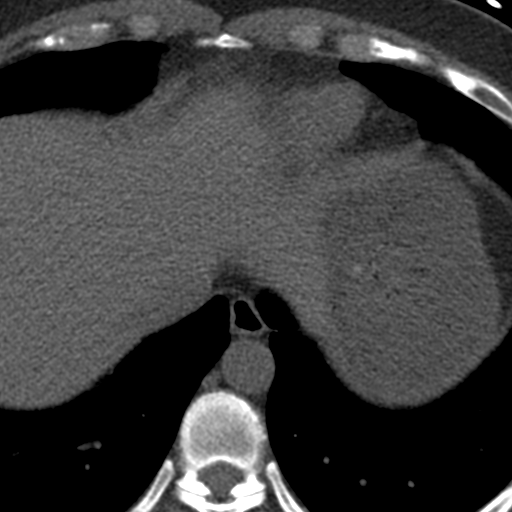
[im 15/45  vessel]
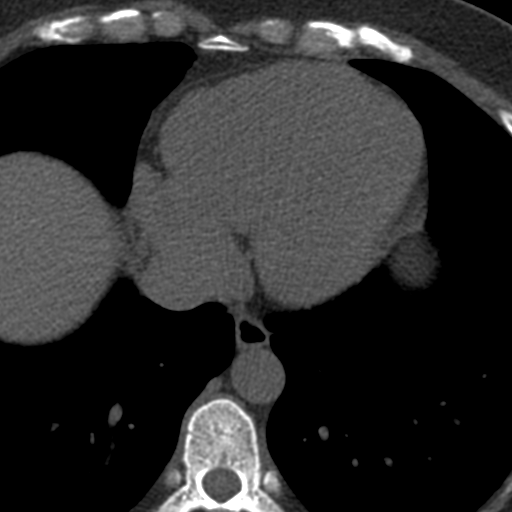
[im 20/45  vessel]
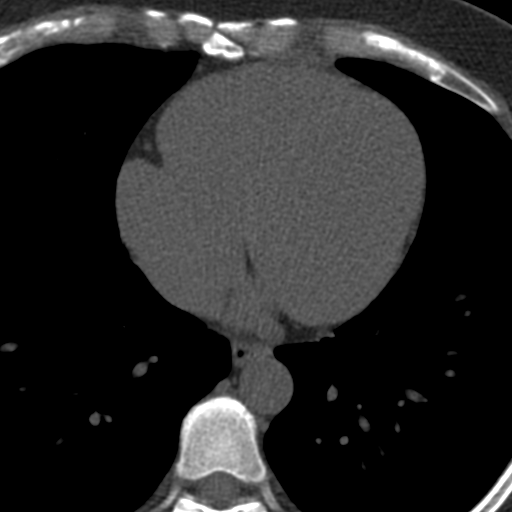
[im 25/45  vessel]
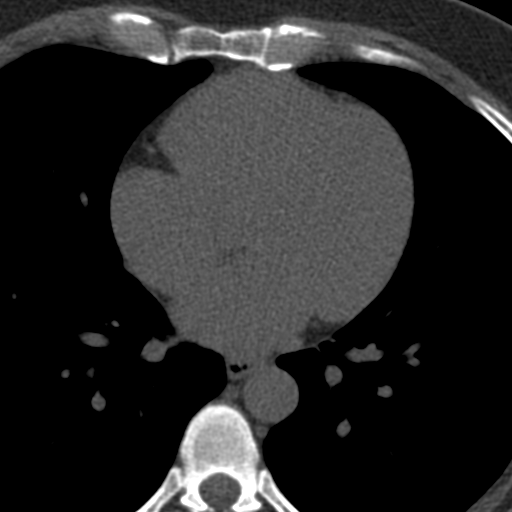
[im 30/45  vessel]
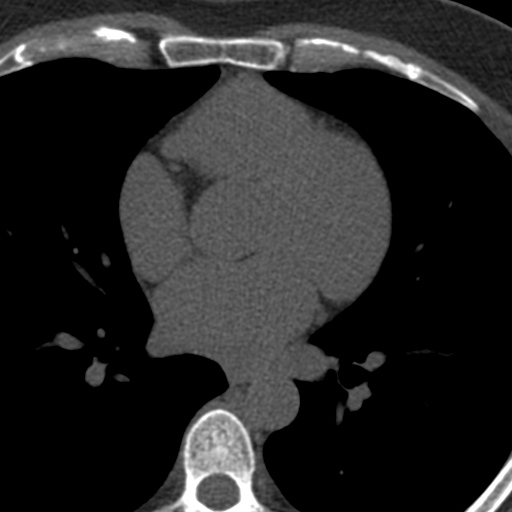
[im 35/45  vessel]
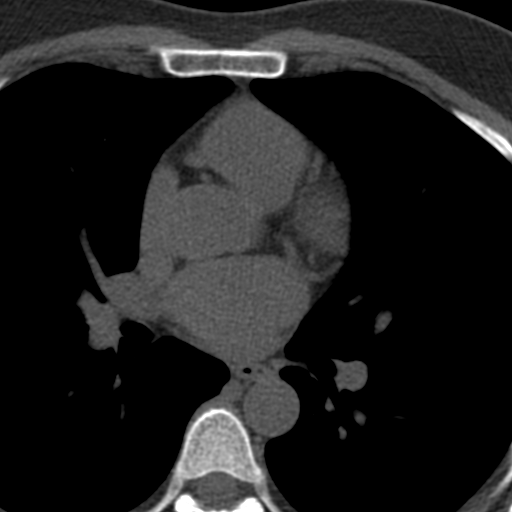

[Series 3: full fov st · axial · 0.74mm/px · z∈[-230,-125]mm · 8 of 45 slices shown, 10 images]
[im 5/45  vessel]
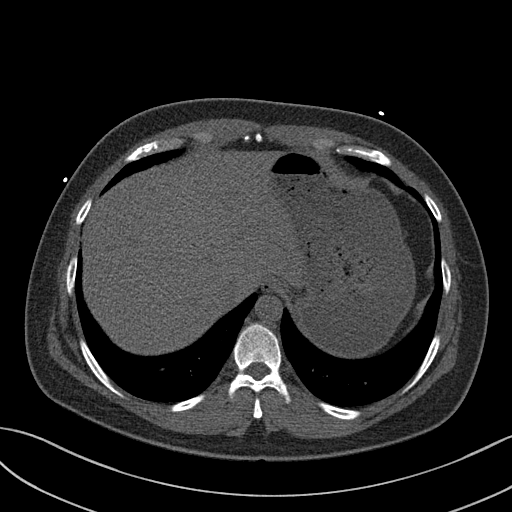
[im 5/45  lung]
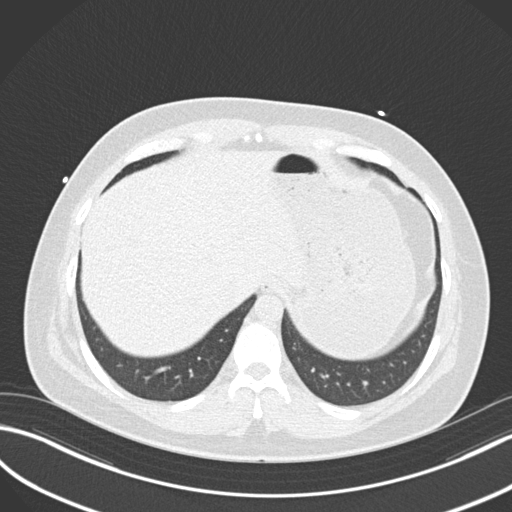
[im 10/45  vessel]
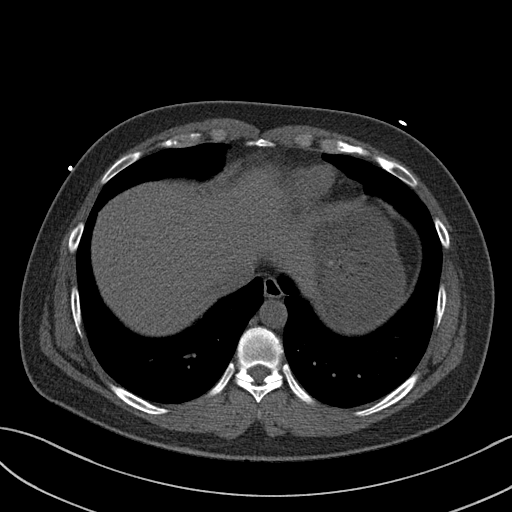
[im 15/45  vessel]
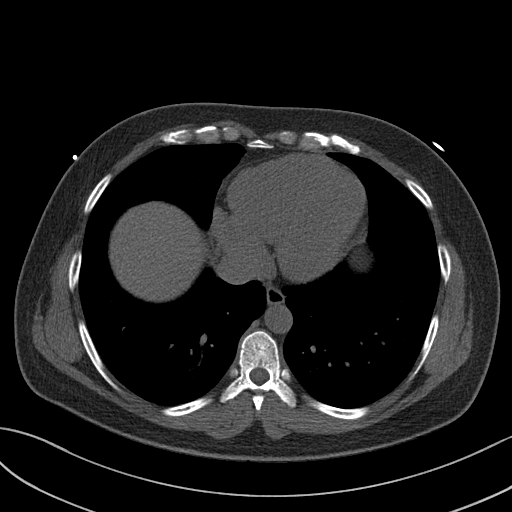
[im 20/45  vessel]
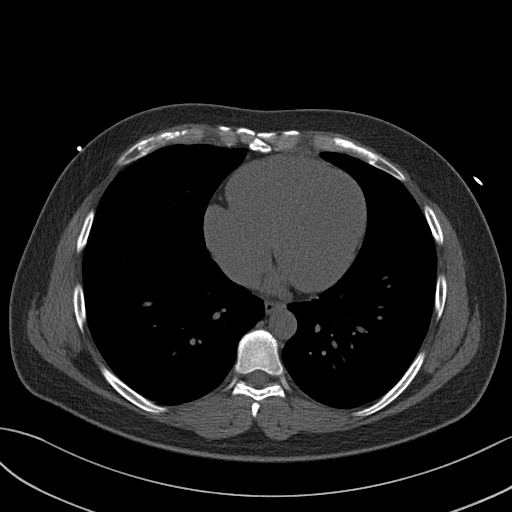
[im 25/45  vessel]
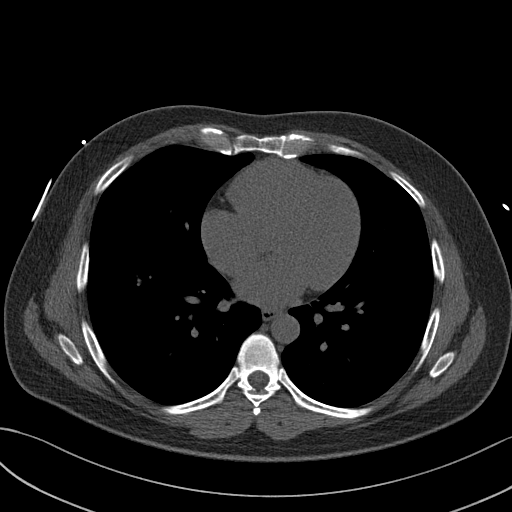
[im 25/45  lung]
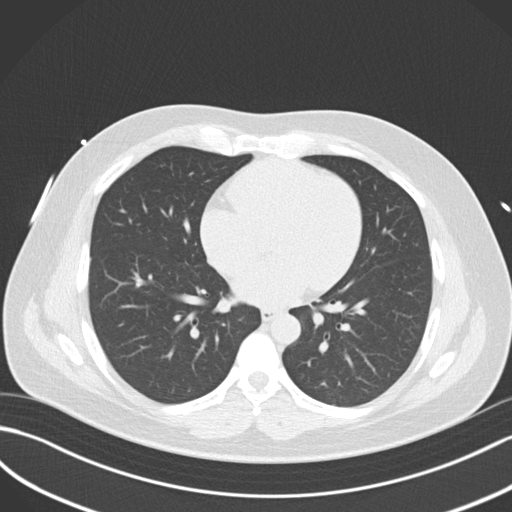
[im 30/45  vessel]
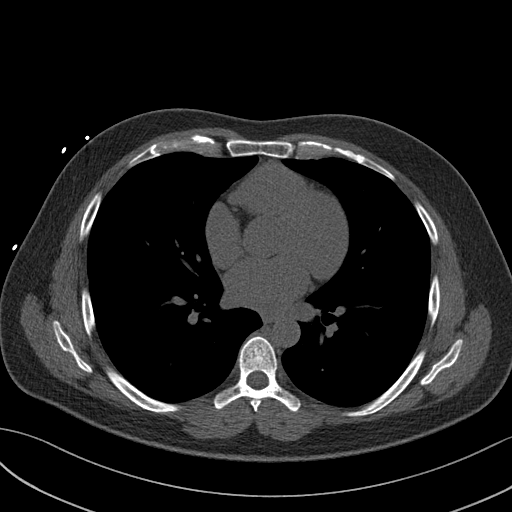
[im 35/45  vessel]
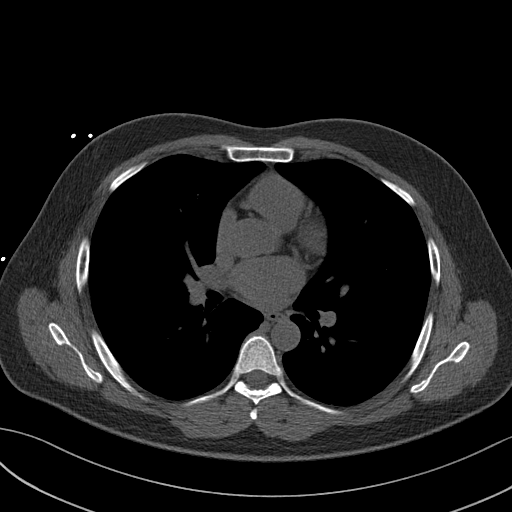
[im 40/45  vessel]
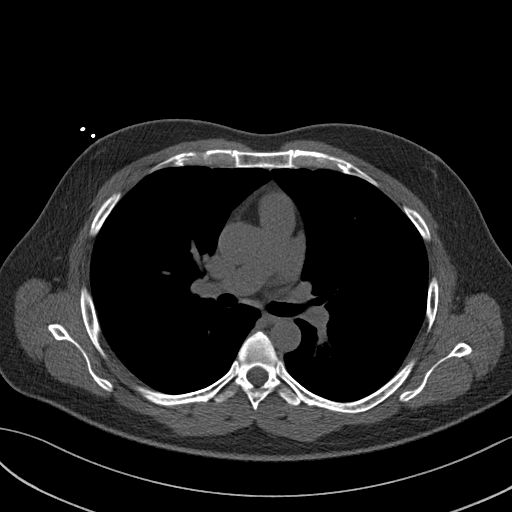

[15 of 20 positions shown; findings below may reference images not displayed]

FINDINGS: Vascular: No aortic aneurysm calcifications. Normal caliber
pulmonary arteries.

Mediastinum/Nodes: No mediastinal or hilar mass or adenopathy. The
visualized esophagus is grossly normal.

Lungs/Pleura: No acute pulmonary findings, worrisome pulmonary
lesions or pulmonary nodules.

Upper Abdomen: No significant upper abdominal findings.

Musculoskeletal: No significant bony findings.
IMPRESSION: No significant extracardiac findings.
FINDINGS: Coronary arteries: Normal origins.

Coronary Calcium Score:

Total: 0

Pericardium: Normal.

Ascending Aorta: Normal caliber.

Non-cardiac: See separate report from [REDACTED].
IMPRESSION: Coronary calcium score of 0.



If CAC=0, it is reasonable to withhold statin therapy and reassess
in 5 to 10 years, as long as higher risk conditions are absent
(diabetes mellitus, family history of premature CHD in first degree
relatives (males <55 years; females <65 years), cigarette smoking,
or LDL >=190 mg/dL).

If CAC is 1 to 99, it is reasonable to initiate statin therapy for
patients >=55 years of age.

If CAC is >=100 or >=75th percentile, it is reasonable to initiate
statin therapy at any age.

Cardiology referral should be considered for patients with CAC
scores >=400 or >=75th percentile.

*5933 AHA/ACC/AACVPR/AAPA/ABC/TILL/STAAKS/NURBADRI/Robert Robson/FOSTER/RANTONA/MONTE
Guideline on the Management of Blood Cholesterol: A Report of the
American College of Cardiology/American Heart Association Task Force
on Clinical Practice Guidelines. J Am Coll Cardiol.
8965;73(24):6343-6242.

*** End of Addendum ***
EXAM:
OVER-READ INTERPRETATION  CT CHEST

The following report is an over-read performed by radiologist Dr. NAZARETH
Huljusija [REDACTED] on 03/11/2022. This over-read
does not include interpretation of cardiac or coronary anatomy or
pathology. The coronary calcium score interpretation by the
cardiologist is attached.
FINDINGS: Vascular: No aortic aneurysm calcifications. Normal caliber
pulmonary arteries.

Mediastinum/Nodes: No mediastinal or hilar mass or adenopathy. The
visualized esophagus is grossly normal.

Lungs/Pleura: No acute pulmonary findings, worrisome pulmonary
lesions or pulmonary nodules.

Upper Abdomen: No significant upper abdominal findings.

Musculoskeletal: No significant bony findings.
IMPRESSION: No significant extracardiac findings.

## 2023-07-12 ENCOUNTER — Encounter: Payer: Self-pay | Admitting: Cardiology

## 2023-07-12 DIAGNOSIS — Z1321 Encounter for screening for nutritional disorder: Secondary | ICD-10-CM

## 2023-07-12 DIAGNOSIS — Z1329 Encounter for screening for other suspected endocrine disorder: Secondary | ICD-10-CM

## 2023-07-12 DIAGNOSIS — Z0000 Encounter for general adult medical examination without abnormal findings: Secondary | ICD-10-CM

## 2023-07-12 DIAGNOSIS — Z131 Encounter for screening for diabetes mellitus: Secondary | ICD-10-CM

## 2023-07-12 DIAGNOSIS — E7800 Pure hypercholesterolemia, unspecified: Secondary | ICD-10-CM

## 2023-07-14 LAB — LIPID PANEL
Chol/HDL Ratio: 4.8 ratio (ref 0.0–5.0)
Cholesterol, Total: 203 mg/dL — ABNORMAL HIGH (ref 100–199)
HDL: 42 mg/dL (ref >39)
LDL Chol Calc (NIH): 142 mg/dL — ABNORMAL HIGH (ref 0–99)
Triglycerides: 107 mg/dL (ref 0–149)
VLDL Cholesterol Cal: 19 mg/dL (ref 5–40)

## 2023-07-14 LAB — COMPREHENSIVE METABOLIC PANEL
ALT: 18 IU/L (ref 0–44)
AST: 20 IU/L (ref 0–40)
Albumin: 4.4 g/dL (ref 4.1–5.1)
Alkaline Phosphatase: 61 IU/L (ref 44–121)
BUN/Creatinine Ratio: 12 (ref 9–20)
BUN: 11 mg/dL (ref 6–24)
Bilirubin Total: 0.4 mg/dL (ref 0.0–1.2)
CO2: 26 mmol/L (ref 20–29)
Calcium: 9.6 mg/dL (ref 8.7–10.2)
Chloride: 103 mmol/L (ref 96–106)
Creatinine, Ser: 0.94 mg/dL (ref 0.76–1.27)
Globulin, Total: 2.3 g/dL (ref 1.5–4.5)
Glucose: 107 mg/dL — ABNORMAL HIGH (ref 70–99)
Potassium: 5.5 mmol/L — ABNORMAL HIGH (ref 3.5–5.2)
Sodium: 139 mmol/L (ref 134–144)
Total Protein: 6.7 g/dL (ref 6.0–8.5)
eGFR: 103 mL/min/{1.73_m2} (ref >59)

## 2023-07-14 LAB — HEMOGLOBIN A1C
Est. average glucose Bld gHb Est-mCnc: 120 mg/dL
Hgb A1c MFr Bld: 5.8 % — ABNORMAL HIGH (ref 4.8–5.6)

## 2023-07-14 LAB — TSH: TSH: 4.53 u[IU]/mL — ABNORMAL HIGH (ref 0.450–4.500)

## 2023-07-14 LAB — HEPATIC FUNCTION PANEL: Bilirubin, Direct: 0.1 mg/dL (ref 0.00–0.40)

## 2023-07-14 LAB — CBC
Hematocrit: 42.4 % (ref 37.5–51.0)
Hemoglobin: 14.2 g/dL (ref 13.0–17.7)
MCH: 28.5 pg (ref 26.6–33.0)
MCHC: 33.5 g/dL (ref 31.5–35.7)
MCV: 85 fL (ref 79–97)
Platelets: 208 10*3/uL (ref 150–450)
RBC: 4.98 x10E6/uL (ref 4.14–5.80)
RDW: 12.8 % (ref 11.6–15.4)
WBC: 4.9 10*3/uL (ref 3.4–10.8)

## 2023-07-14 LAB — VITAMIN D 25 HYDROXY (VIT D DEFICIENCY, FRACTURES): Vit D, 25-Hydroxy: 25.3 ng/mL — ABNORMAL LOW (ref 30.0–100.0)

## 2023-07-15 ENCOUNTER — Telehealth: Payer: Self-pay

## 2023-07-15 ENCOUNTER — Encounter: Payer: Self-pay | Admitting: Cardiology

## 2023-07-15 ENCOUNTER — Ambulatory Visit: Payer: Managed Care, Other (non HMO) | Attending: Cardiology | Admitting: Cardiology

## 2023-07-15 VITALS — BP 102/60 | HR 74 | Ht 66.0 in | Wt 184.0 lb

## 2023-07-15 DIAGNOSIS — E782 Mixed hyperlipidemia: Secondary | ICD-10-CM

## 2023-07-15 DIAGNOSIS — E7800 Pure hypercholesterolemia, unspecified: Secondary | ICD-10-CM | POA: Diagnosis not present

## 2023-07-15 DIAGNOSIS — E875 Hyperkalemia: Secondary | ICD-10-CM

## 2023-07-15 MED ORDER — SODIUM POLYSTYRENE SULFONATE PO POWD
Freq: Once | ORAL | 0 refills | Status: AC
Start: 2023-07-15 — End: 2023-07-15

## 2023-07-15 NOTE — Telephone Encounter (Signed)
-----   Message from Garwin Brothers sent at 07/15/2023  4:02 PM EDT ----- After he left I realized he had a potassium of 5.5.  Kayexalate 15 g 1 dose and recheck potassium in 1 week.  Keep foods low in potassium.  Copy primary care Garwin Brothers, MD 07/15/2023 4:02 PM

## 2023-07-15 NOTE — Progress Notes (Signed)
Cardiology Office Note:    Date:  07/15/2023   ID:  Edgar Huang, DOB 1981-09-20, MRN [id number]  PCP:  Truett Perna, MD  Cardiologist:  Garwin Brothers, MD   Referring MD: Truett Perna, MD    ASSESSMENT:    1. Elevated LDL cholesterol level   2. Mixed dyslipidemia    PLAN:    In order of problems listed above:  Primary prevention stressed with the patient.  Importance of compliance with diet medication stressed and patient verbalized standing. Patient was advised to walk at least half an hour a day, 5 days a week and he promises to do so Mixed dyslipidemia: I discussed this with the patient at extensive length diet was emphasized and he promises to do better.  He will be back in 4 months for follow-up blood work.  Weight reduction was stressed. An A1c is also mildly elevated.  In addition vitamin D level is low and he will talk to his primary care about all his blood work. Patient will be seen in follow-up appointment in 6 months or earlier if the patient has any concerns.    Medication Adjustments/Labs and Tests Ordered: Current medicines are reviewed at length with the patient today.  Concerns regarding medicines are outlined above.  Orders Placed This Encounter  Procedures   EKG 12-Lead   No orders of the defined types were placed in this encounter.    No chief complaint on file.    History of Present Illness:    Edgar Huang is a 42 y.o. male.  Patient has past medical history of mixed dyslipidemia.  He denies any problems at this time and takes care of activities of daily living.  No chest pain orthopnea or PND.  He mentions to me that he has not been as regular and intense with exercise which is gained weight.  At the time of my evaluation, the patient is alert awake oriented and in no distress.  Past Medical History:  Diagnosis Date   Cervical radicular pain 01/30/2015   Chest discomfort 10/06/2019   Chronic right shoulder pain 09/17/2021   Foraminal  stenosis of cervical region 07/07/2022   Neck pain 09/17/2021   Trapezius strain 01/30/2015   Wellness examination 11/02/2014    Past Surgical History:  Procedure Laterality Date   NO PAST SURGERIES      Current Medications: Current Meds  Medication Sig   Multiple Vitamin (MULTIVITAMIN PO) Take 1 tablet by mouth daily.     Allergies:   Patient has no known allergies.   Social History   Socioeconomic History   Marital status: Married    Spouse name: Not on file   Number of children: Not on file   Years of education: Not on file   Highest education level: Not on file  Occupational History   Not on file  Tobacco Use   Smoking status: Never   Smokeless tobacco: Never  Substance and Sexual Activity   Alcohol use: No    Alcohol/week: 0.0 standard drinks of alcohol   Drug use: No   Sexual activity: Not on file  Other Topics Concern   Not on file  Social History Narrative   Not on file   Social Determinants of Health   Financial Resource Strain: Not on file  Food Insecurity: Not on file  Transportation Needs: Not on file  Physical Activity: Not on file  Stress: Not on file  Social Connections: Not on file     Family History: The  patient's family history includes Diabetes in his father and mother.  ROS:   Please see the history of present illness.    All other systems reviewed and are negative.  EKGs/Labs/Other Studies Reviewed:    The following studies were reviewed today: .Marland KitchenEKG Interpretation Date/Time:  Thursday July 15 2023 14:21:45 EDT Ventricular Rate:  74 PR Interval:  148 QRS Duration:  86 QT Interval:  374 QTC Calculation: 415 R Axis:   -17  Text Interpretation: Normal sinus rhythm Minimal voltage criteria for LVH, may be normal variant ( R in aVL ) Anterolateral infarct , age undetermined No previous ECGs available Confirmed by Belva Crome [id number]) on 07/15/2023 2:51:34 PM     Recent Labs: 07/13/2023: ALT 18; BUN 11; Creatinine, Ser  0.94; Hemoglobin 14.2; Platelets 208; Potassium 5.5; Sodium 139; TSH 4.530  Recent Lipid Panel    Component Value Date/Time   CHOL 203 (H) 07/13/2023 0845   TRIG 107 07/13/2023 0845   HDL 42 07/13/2023 0845   CHOLHDL 4.8 07/13/2023 0845   CHOLHDL 5 11/08/2014 0910   VLDL 28.8 11/08/2014 0910   LDLCALC 142 (H) 07/13/2023 0845    Physical Exam:    VS:  BP 102/60   Pulse 74   Ht 5\' 6"  (1.676 m)   Wt 184 lb (83.5 kg)   SpO2 96%   BMI 29.70 kg/m     Wt Readings from Last 3 Encounters:  07/15/23 184 lb (83.5 kg)  02/23/22 178 lb 0.6 oz (80.8 kg)  10/06/19 143 lb 1.3 oz (64.9 kg)     GEN: Patient is in no acute distress HEENT: Normal NECK: No JVD; No carotid bruits LYMPHATICS: No lymphadenopathy CARDIAC: Hear sounds regular, 2/6 systolic murmur at the apex. RESPIRATORY:  Clear to auscultation without rales, wheezing or rhonchi  ABDOMEN: Soft, non-tender, non-distended MUSCULOSKELETAL:  No edema; No deformity  SKIN: Warm and dry NEUROLOGIC:  Alert and oriented x 3 PSYCHIATRIC:  Normal affect   Signed, Garwin Brothers, MD  07/15/2023 3:05 PM    [facility]

## 2023-07-15 NOTE — Patient Instructions (Signed)
Medication Instructions:  Your physician recommends that you continue on your current medications as directed. Please refer to the Current Medication list given to you today.  *If you need a refill on your cardiac medications before your next appointment, please call your pharmacy*   Lab Work: Your physician recommends that you return for lab work in: 4 months for CMP and lipids. You need to have labs done when you are fasting. [facility] lab is located on the 3rd floor, [address]. Hours are Monday - Friday 8 am to 4 pm, closed 11:30 am to 1:00 pm. You do NOT need an appointment.   If you have labs (blood work) drawn today and your tests are completely normal, you will receive your results only by: MyChart Message (if you have MyChart) OR A paper copy in the mail If you have any lab test that is abnormal or we need to change your treatment, we will call you to review the results.   Testing/Procedures: None ordered   Follow-Up: At [facility], you and your health needs are our priority.  As part of our continuing mission to provide you with exceptional heart care, we have created designated Provider Care Teams.  These Care Teams include your primary Cardiologist (physician) and Advanced Practice Providers (APPs -  Physician Assistants and Nurse Practitioners) who all work together to provide you with the care you need, when you need it.  We recommend signing up for the patient portal called "MyChart".  Sign up information is provided on this After Visit Summary.  MyChart is used to connect with patients for Virtual Visits (Telemedicine).  Patients are able to view lab/test results, encounter notes, upcoming appointments, etc.  Non-urgent messages can be sent to your provider as well.   To learn more about what you can do with MyChart, go to [email or web address].    Your next appointment:   9 month(s)  The format for your next appointment:   In Person  Provider:   Belva Crome, MD    Other Instructions none  Important Information About Sugar

## 2023-07-16 ENCOUNTER — Other Ambulatory Visit: Payer: Self-pay | Admitting: Cardiology

## 2023-07-16 MED ORDER — SODIUM POLYSTYRENE SULFONATE PO POWD
Freq: Once | ORAL | 0 refills | Status: AC
Start: 2023-07-16 — End: 2023-07-16

## 2023-07-16 NOTE — Telephone Encounter (Signed)
Per Dr. Tomie China it is okay to give the liquid Kayexalate instead of the powder.

## 2023-07-16 NOTE — Addendum Note (Signed)
Addended by: Eleonore Chiquito on: 07/16/2023 09:22 AM   Modules accepted: Orders

## 2023-07-23 LAB — BASIC METABOLIC PANEL
BUN/Creatinine Ratio: 13 (ref 9–20)
BUN: 13 mg/dL (ref 6–24)
CO2: 24 mmol/L (ref 20–29)
Calcium: 9.6 mg/dL (ref 8.7–10.2)
Chloride: 102 mmol/L (ref 96–106)
Creatinine, Ser: 0.98 mg/dL (ref 0.76–1.27)
Glucose: 98 mg/dL (ref 70–99)
Potassium: 4.9 mmol/L (ref 3.5–5.2)
Sodium: 138 mmol/L (ref 134–144)
eGFR: 98 mL/min/{1.73_m2} (ref >59)
